# Patient Record
Sex: Male | Born: 1960 | Race: White | Hispanic: No | Marital: Married | Smoking: Former smoker
Health system: Southern US, Community
[De-identification: ages and names within clinical notes are randomized; demographics above are authoritative.]

## PROBLEM LIST (undated history)

## (undated) DIAGNOSIS — I10 Essential (primary) hypertension: Secondary | ICD-10-CM

## (undated) DIAGNOSIS — I2510 Atherosclerotic heart disease of native coronary artery without angina pectoris: Secondary | ICD-10-CM

## (undated) DIAGNOSIS — E119 Type 2 diabetes mellitus without complications: Secondary | ICD-10-CM

## (undated) DIAGNOSIS — J45909 Unspecified asthma, uncomplicated: Secondary | ICD-10-CM

## (undated) DIAGNOSIS — T3110 Burns involving 10-19% of body surface with 0% to 9% third degree burns: Secondary | ICD-10-CM

## (undated) DIAGNOSIS — S3992XA Unspecified injury of lower back, initial encounter: Secondary | ICD-10-CM

## (undated) DIAGNOSIS — M1990 Unspecified osteoarthritis, unspecified site: Secondary | ICD-10-CM

## (undated) HISTORY — PX: OTHER SURGICAL HISTORY: SHX169

## (undated) HISTORY — PX: SHOULDER SURGERY: SHX246

---

## 2010-09-27 HISTORY — PX: CARDIAC CATHETERIZATION: SHX172

## 2015-03-04 ENCOUNTER — Other Ambulatory Visit: Payer: Worker's Compensation | Admitting: Neurosurgery

## 2015-03-25 ENCOUNTER — Encounter (HOSPITAL_COMMUNITY): Payer: Self-pay

## 2015-03-25 ENCOUNTER — Encounter (HOSPITAL_COMMUNITY)
Admission: RE | Admit: 2015-03-25 | Discharge: 2015-03-25 | Disposition: A | Discharge status: Home / Self Care | Payer: Worker's Compensation | Source: Ambulatory Visit | Attending: Neurosurgery | Admitting: Neurosurgery

## 2015-03-25 ENCOUNTER — Other Ambulatory Visit (HOSPITAL_COMMUNITY): Payer: Self-pay | Admitting: *Deleted

## 2015-03-25 DIAGNOSIS — Z01812 Encounter for preprocedural laboratory examination: Secondary | ICD-10-CM | POA: Diagnosis not present

## 2015-03-25 DIAGNOSIS — M5136 Other intervertebral disc degeneration, lumbar region: Secondary | ICD-10-CM | POA: Diagnosis not present

## 2015-03-25 DIAGNOSIS — M4806 Spinal stenosis, lumbar region: Secondary | ICD-10-CM | POA: Insufficient documentation

## 2015-03-25 DIAGNOSIS — I10 Essential (primary) hypertension: Secondary | ICD-10-CM | POA: Insufficient documentation

## 2015-03-25 DIAGNOSIS — Z01818 Encounter for other preprocedural examination: Secondary | ICD-10-CM | POA: Insufficient documentation

## 2015-03-25 DIAGNOSIS — I2510 Atherosclerotic heart disease of native coronary artery without angina pectoris: Secondary | ICD-10-CM | POA: Diagnosis not present

## 2015-03-25 DIAGNOSIS — E119 Type 2 diabetes mellitus without complications: Secondary | ICD-10-CM | POA: Diagnosis not present

## 2015-03-25 DIAGNOSIS — Z87891 Personal history of nicotine dependence: Secondary | ICD-10-CM | POA: Insufficient documentation

## 2015-03-25 DIAGNOSIS — M4316 Spondylolisthesis, lumbar region: Secondary | ICD-10-CM | POA: Insufficient documentation

## 2015-03-25 DIAGNOSIS — M5126 Other intervertebral disc displacement, lumbar region: Secondary | ICD-10-CM | POA: Diagnosis not present

## 2015-03-25 DIAGNOSIS — Z0183 Encounter for blood typing: Secondary | ICD-10-CM | POA: Diagnosis not present

## 2015-03-25 HISTORY — DX: Atherosclerotic heart disease of native coronary artery without angina pectoris: I25.10

## 2015-03-25 HISTORY — DX: Unspecified asthma, uncomplicated: J45.909

## 2015-03-25 HISTORY — DX: Essential (primary) hypertension: I10

## 2015-03-25 HISTORY — DX: Unspecified osteoarthritis, unspecified site: M19.90

## 2015-03-25 HISTORY — DX: Type 2 diabetes mellitus without complications: E11.9

## 2015-03-25 HISTORY — DX: Burns involving 10-19% of body surface with 0% to 9% third degree burns: T31.10

## 2015-03-25 HISTORY — DX: Unspecified injury of lower back, initial encounter: S39.92XA

## 2015-03-25 LAB — BASIC METABOLIC PANEL
Anion gap: 8 (ref 5–15)
BUN: 14 mg/dL (ref 6–20)
CO2: 25 mmol/L (ref 22–32)
Calcium: 8.9 mg/dL (ref 8.9–10.3)
Chloride: 101 mmol/L (ref 101–111)
Creatinine, Ser: 1.06 mg/dL (ref 0.61–1.24)
GFR calc Af Amer: >60 mL/min (ref >60)
Glucose, Bld: 207 mg/dL — ABNORMAL HIGH (ref 65–99)
POTASSIUM: 4.2 mmol/L (ref 3.5–5.1)
SODIUM: 134 mmol/L — AB (ref 135–145)

## 2015-03-25 LAB — ABO/RH: ABO/RH(D): O POS

## 2015-03-25 LAB — SURGICAL PCR SCREEN
MRSA, PCR: NEGATIVE
Staphylococcus aureus: NEGATIVE

## 2015-03-25 LAB — CBC
HCT: 46.1 % (ref 39.0–52.0)
Hemoglobin: 16.3 g/dL (ref 13.0–17.0)
MCH: 29.2 pg (ref 26.0–34.0)
MCHC: 35.4 g/dL (ref 30.0–36.0)
MCV: 82.5 fL (ref 78.0–100.0)
Platelets: 226 10*3/uL (ref 150–400)
RBC: 5.59 MIL/uL (ref 4.22–5.81)
RDW: 12.5 % (ref 11.5–15.5)
WBC: 4.3 10*3/uL (ref 4.0–10.5)

## 2015-03-25 LAB — GLUCOSE, CAPILLARY: Glucose-Capillary: 281 mg/dL — ABNORMAL HIGH (ref 65–99)

## 2015-03-25 LAB — TYPE AND SCREEN
ABO/RH(D): O POS
Antibody Screen: NEGATIVE

## 2015-03-25 NOTE — Pre-Procedure Instructions (Signed)
Patrice ParadiseJonathan Gloster  03/25/2015     Your procedure is scheduled on Thursday, April 03, 2015 at 7:30 AM.   Report to [facility] Entrance "A" Admitting Office at 5:30 AM.   Call this number if you have problems the morning of surgery: [phone]   Any questions prior to day of surgery, please call [phone] between 8 & 4 PM.    Remember:  Do not eat food or drink liquids after midnight Wednesday, 04/02/15.  Take these medicines the morning of surgery with A SIP OF WATER: None  Stop Aspirin 7 days prior to surgery.  Do NOT take any diabetic medications the morning of surgery.   Do not wear jewelry.  Do not wear lotions, powders, or cologne.  You may wear deodorant.  Men may shave face and neck.  Do not bring valuables to the hospital.  [facility] is not responsible for any belongings or valuables.  Contacts, dentures or bridgework may not be worn into surgery.  Leave your suitcase in the car.  After surgery it may be brought to your room.  For patients admitted to the hospital, discharge time will be determined by your treatment team.  Special instructions:  [place] - Preparing for Surgery  Before surgery, you can play an important role.  Because skin is not sterile, your skin needs to be as free of germs as possible.  You can reduce the number of germs on you skin by washing with CHG (chlorahexidine gluconate) soap before surgery.  CHG is an antiseptic cleaner which kills germs and bonds with the skin to continue killing germs even after washing.  Please DO NOT use if you have an allergy to CHG or antibacterial soaps.  If your skin becomes reddened/irritated stop using the CHG and inform your nurse when you arrive at Short Stay.  Do not shave (including legs and underarms) for at least 48 hours prior to the first CHG shower.  You may shave your face.  Please follow these instructions carefully:   1.  Shower with CHG Soap the night before surgery and the                                 morning of Surgery.  2.  If you choose to wash your hair, wash your hair first as usual with your       normal shampoo.  3.  After you shampoo, rinse your hair and body thoroughly to remove the                      Shampoo.  4.  Use CHG as you would any other liquid soap.  You can apply chg directly       to the skin and wash gently with scrungie or a clean washcloth.  5.  Apply the CHG Soap to your body ONLY FROM THE NECK DOWN.        Do not use on open wounds or open sores.  Avoid contact with your eyes, ears, mouth and genitals (private parts).  Wash genitals (private parts) with your normal soap.  6.  Wash thoroughly, paying special attention to the area where your surgery        will be performed.  7.  Thoroughly rinse your body with warm water from the neck down.  8.  DO NOT shower/wash with your normal soap after using and rinsing  off       the CHG Soap.  9.  Pat yourself dry with a clean towel.            10.  Wear clean pajamas.            11.  Place clean sheets on your bed the night of your first shower and do not        sleep with pets.  Day of Surgery  Do not apply any lotions the morning of surgery.  Please wear clean clothes to the hospital.     Please read over the following fact sheets that you were given. Pain Booklet, Coughing and Deep Breathing, Blood Transfusion Information, MRSA Information and Surgical Site Infection Prevention

## 2015-03-25 NOTE — Progress Notes (Signed)
Pt states he had his Hgb A1c drawn last week at his PCP's office. States it was 7.1. Pt's PCP is Dr. Alease FrameJennifer Szurgot [place]. Called office and requested result be faxed to us, spoke with [place]. Pt's CBG was 283 today, instructed pt to check his blood sugars in the AM and make sure they are lower than 250. Encouraged him to watch his diet. He voiced understanding. Endocrinologist is Dr. Tennis MustNancy Blackard in [place].  Pt states that he had a cardiac cath in 2012 and found some small blockages. States he is followed by Dr. Rickard PatienceScott Anderson, cardi[place] in [place]. He denies any recent chest pain or sob. Will request last OV notes from Dr. Dareen PianoAnderson and also any cardiac studies. Will see if they have a copy of cath report from [place]. Pt had an EKG at [facility] in September, 2015. Requested copy of EKG tracing from them. Written report is in Care Everywhere.

## 2015-03-25 NOTE — Progress Notes (Signed)
Spoke with Jomara at Dr. Ewell PoeAnderson's office and they do have a copy of cardiac cath from 2012 and pt was seen in February 2016. He has not had any procedures/tests done. They will fax the cath report and the last office visit notes.
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
## 2015-04-02 MED ORDER — CEFAZOLIN SODIUM-DEXTROSE 2-3 GM-% IV SOLR
2.0000 g | INTRAVENOUS | Status: AC
  Administered 2015-04-03: 2 g via INTRAVENOUS
  Filled 2015-04-02: qty 50
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
## 2015-04-03 ENCOUNTER — Inpatient Hospital Stay (HOSPITAL_COMMUNITY): Payer: Worker's Compensation | Admitting: Emergency Medicine

## 2015-04-03 ENCOUNTER — Inpatient Hospital Stay (HOSPITAL_COMMUNITY)
Admission: RE | Admit: 2015-04-03 | Discharge: 2015-04-04 | DRG: 460 | Disposition: A | Discharge status: Home / Self Care | Payer: Worker's Compensation | Source: Ambulatory Visit | Attending: Neurosurgery | Admitting: Neurosurgery

## 2015-04-03 ENCOUNTER — Encounter (HOSPITAL_COMMUNITY)
Admission: RE | Disposition: A | Discharge status: Home / Self Care | Payer: Self-pay | Source: Ambulatory Visit | Attending: Neurosurgery

## 2015-04-03 ENCOUNTER — Inpatient Hospital Stay (HOSPITAL_COMMUNITY): Payer: Worker's Compensation | Admitting: Anesthesiology

## 2015-04-03 ENCOUNTER — Encounter (HOSPITAL_COMMUNITY): Payer: Self-pay | Admitting: Certified Registered"

## 2015-04-03 ENCOUNTER — Inpatient Hospital Stay (HOSPITAL_COMMUNITY): Payer: Worker's Compensation

## 2015-04-03 DIAGNOSIS — Z794 Long term (current) use of insulin: Secondary | ICD-10-CM | POA: Diagnosis not present

## 2015-04-03 DIAGNOSIS — Z87891 Personal history of nicotine dependence: Secondary | ICD-10-CM | POA: Diagnosis not present

## 2015-04-03 DIAGNOSIS — Z79899 Other long term (current) drug therapy: Secondary | ICD-10-CM | POA: Diagnosis not present

## 2015-04-03 DIAGNOSIS — Z419 Encounter for procedure for purposes other than remedying health state, unspecified: Secondary | ICD-10-CM

## 2015-04-03 DIAGNOSIS — M5116 Intervertebral disc disorders with radiculopathy, lumbar region: Secondary | ICD-10-CM | POA: Diagnosis present

## 2015-04-03 DIAGNOSIS — E119 Type 2 diabetes mellitus without complications: Secondary | ICD-10-CM | POA: Diagnosis present

## 2015-04-03 DIAGNOSIS — M4806 Spinal stenosis, lumbar region: Secondary | ICD-10-CM | POA: Diagnosis present

## 2015-04-03 DIAGNOSIS — E780 Pure hypercholesterolemia: Secondary | ICD-10-CM | POA: Diagnosis present

## 2015-04-03 DIAGNOSIS — M545 Low back pain: Secondary | ICD-10-CM | POA: Diagnosis present

## 2015-04-03 DIAGNOSIS — M4316 Spondylolisthesis, lumbar region: Secondary | ICD-10-CM | POA: Diagnosis present

## 2015-04-03 DIAGNOSIS — I10 Essential (primary) hypertension: Secondary | ICD-10-CM | POA: Diagnosis present

## 2015-04-03 HISTORY — PX: MAXIMUM ACCESS (MAS)POSTERIOR LUMBAR INTERBODY FUSION (PLIF) 1 LEVEL: SHX6368

## 2015-04-03 LAB — GLUCOSE, CAPILLARY
GLUCOSE-CAPILLARY: 147 mg/dL — AB (ref 65–99)
GLUCOSE-CAPILLARY: 192 mg/dL — AB (ref 65–99)
GLUCOSE-CAPILLARY: 213 mg/dL — AB (ref 65–99)
Glucose-Capillary: 181 mg/dL — ABNORMAL HIGH (ref 65–99)
Glucose-Capillary: 199 mg/dL — ABNORMAL HIGH (ref 65–99)

## 2015-04-03 SURGERY — FOR MAXIMUM ACCESS (MAS) POSTERIOR LUMBAR INTERBODY FUSION (PLIF) 1 LEVEL
Anesthesia: General | Site: Back

## 2015-04-03 MED ORDER — DEXAMETHASONE SODIUM PHOSPHATE 4 MG/ML IJ SOLN
INTRAMUSCULAR | Status: AC
  Filled 2015-04-03: qty 1

## 2015-04-03 MED ORDER — SUFENTANIL CITRATE 50 MCG/ML IV SOLN
INTRAVENOUS | Status: DC | PRN
  Administered 2015-04-03 (×3): 10 ug via INTRAVENOUS
  Administered 2015-04-03: 20 ug via INTRAVENOUS

## 2015-04-03 MED ORDER — HYDROMORPHONE HCL 1 MG/ML IJ SOLN
INTRAMUSCULAR | Status: AC
  Administered 2015-04-03: 0.5 mg via INTRAVENOUS
  Filled 2015-04-03: qty 2

## 2015-04-03 MED ORDER — GLYCOPYRROLATE 0.2 MG/ML IJ SOLN
INTRAMUSCULAR | Status: AC
  Filled 2015-04-03: qty 2

## 2015-04-03 MED ORDER — BUPIVACAINE HCL (PF) 0.5 % IJ SOLN
INTRAMUSCULAR | Status: DC | PRN
  Administered 2015-04-03: 10 mL

## 2015-04-03 MED ORDER — SUFENTANIL CITRATE 50 MCG/ML IV SOLN
INTRAVENOUS | Status: AC
  Filled 2015-04-03: qty 1

## 2015-04-03 MED ORDER — SODIUM CHLORIDE 0.9 % IJ SOLN
3.0000 mL | Freq: Two times a day (BID) | INTRAMUSCULAR | Status: DC
Start: 2015-04-03 — End: 2015-04-04
  Administered 2015-04-03: 3 mL via INTRAVENOUS

## 2015-04-03 MED ORDER — ACETAMINOPHEN 650 MG RE SUPP: 650.0000 mg | RECTAL | Status: DC | PRN

## 2015-04-03 MED ORDER — PROPOFOL 10 MG/ML IV BOLUS
INTRAVENOUS | Status: DC | PRN
  Administered 2015-04-03: 200 mg via INTRAVENOUS
  Administered 2015-04-03: 100 mg via INTRAVENOUS

## 2015-04-03 MED ORDER — FLEET ENEMA 7-19 GM/118ML RE ENEM
1.0000 | ENEMA | Freq: Once | RECTAL | Status: AC | PRN
  Filled 2015-04-03: qty 1

## 2015-04-03 MED ORDER — ACETAMINOPHEN 325 MG PO TABS: 650.0000 mg | ORAL_TABLET | ORAL | Status: DC | PRN

## 2015-04-03 MED ORDER — SODIUM CHLORIDE 0.9 % IJ SOLN: 3.0000 mL | INTRAMUSCULAR | Status: DC | PRN

## 2015-04-03 MED ORDER — THROMBIN 20000 UNITS EX SOLR
CUTANEOUS | Status: DC | PRN
  Administered 2015-04-03: 20 mL via TOPICAL

## 2015-04-03 MED ORDER — ONDANSETRON HCL 4 MG/2ML IJ SOLN
INTRAMUSCULAR | Status: AC
  Filled 2015-04-03: qty 2

## 2015-04-03 MED ORDER — PROPOFOL 10 MG/ML IV BOLUS
INTRAVENOUS | Status: AC
  Filled 2015-04-03: qty 20

## 2015-04-03 MED ORDER — CEFAZOLIN SODIUM-DEXTROSE 2-3 GM-% IV SOLR
2.0000 g | Freq: Three times a day (TID) | INTRAVENOUS | Status: DC
  Filled 2015-04-03 (×2): qty 50

## 2015-04-03 MED ORDER — DOCUSATE SODIUM 100 MG PO CAPS
100.0000 mg | ORAL_CAPSULE | Freq: Two times a day (BID) | ORAL | Status: DC
  Administered 2015-04-03 – 2015-04-04 (×2): 100 mg via ORAL
  Filled 2015-04-03: qty 1

## 2015-04-03 MED ORDER — DEXTROSE 5 % IV SOLN
500.0000 mg | Freq: Four times a day (QID) | INTRAVENOUS | Status: DC | PRN
  Filled 2015-04-03: qty 5

## 2015-04-03 MED ORDER — METFORMIN HCL 500 MG PO TABS
500.0000 mg | ORAL_TABLET | Freq: Two times a day (BID) | ORAL | Status: DC
  Administered 2015-04-03 – 2015-04-04 (×2): 500 mg via ORAL
  Filled 2015-04-03 (×4): qty 1

## 2015-04-03 MED ORDER — ONDANSETRON HCL 4 MG/2ML IJ SOLN
INTRAMUSCULAR | Status: DC | PRN
  Administered 2015-04-03: 4 mg via INTRAVENOUS

## 2015-04-03 MED ORDER — DULAGLUTIDE 0.75 MG/0.5ML ~~LOC~~ SOAJ: 0.7500 mg | SUBCUTANEOUS | Status: DC

## 2015-04-03 MED ORDER — BUPIVACAINE LIPOSOME 1.3 % IJ SUSP
INTRAMUSCULAR | Status: DC | PRN
  Administered 2015-04-03: 20 mL

## 2015-04-03 MED ORDER — INSULIN ASPART 100 UNIT/ML ~~LOC~~ SOLN: 0.0000 [IU] | Freq: Every day | SUBCUTANEOUS | Status: DC

## 2015-04-03 MED ORDER — PHENYLEPHRINE 40 MCG/ML (10ML) SYRINGE FOR IV PUSH (FOR BLOOD PRESSURE SUPPORT)
PREFILLED_SYRINGE | INTRAVENOUS | Status: AC
  Filled 2015-04-03: qty 10

## 2015-04-03 MED ORDER — LIDOCAINE-EPINEPHRINE 1 %-1:100000 IJ SOLN
INTRAMUSCULAR | Status: DC | PRN
  Administered 2015-04-03: 10 mL

## 2015-04-03 MED ORDER — PANTOPRAZOLE SODIUM 40 MG IV SOLR
40.0000 mg | Freq: Every day | INTRAVENOUS | Status: DC
  Filled 2015-04-03: qty 40

## 2015-04-03 MED ORDER — HYDROMORPHONE HCL 1 MG/ML IJ SOLN
0.5000 mg | INTRAMUSCULAR | Status: DC | PRN
  Administered 2015-04-03 – 2015-04-04 (×4): 1 mg via INTRAVENOUS
  Filled 2015-04-03 (×4): qty 1

## 2015-04-03 MED ORDER — ALUM & MAG HYDROXIDE-SIMETH 200-200-20 MG/5ML PO SUSP
30.0000 mL | Freq: Four times a day (QID) | ORAL | Status: DC | PRN
  Administered 2015-04-03: 30 mL via ORAL
  Filled 2015-04-03: qty 30

## 2015-04-03 MED ORDER — DEXAMETHASONE SODIUM PHOSPHATE 4 MG/ML IJ SOLN
INTRAMUSCULAR | Status: DC | PRN
  Administered 2015-04-03: 4 mg via INTRAVENOUS

## 2015-04-03 MED ORDER — PROMETHAZINE HCL 25 MG/ML IJ SOLN: 6.2500 mg | INTRAMUSCULAR | Status: DC | PRN

## 2015-04-03 MED ORDER — SODIUM CHLORIDE 0.9 % IV SOLN: 250.0000 mL | INTRAVENOUS | Status: DC

## 2015-04-03 MED ORDER — SUCCINYLCHOLINE CHLORIDE 20 MG/ML IJ SOLN
INTRAMUSCULAR | Status: DC | PRN
  Administered 2015-04-03: 50 mg via INTRAVENOUS

## 2015-04-03 MED ORDER — LIDOCAINE HCL (CARDIAC) 20 MG/ML IV SOLN
INTRAVENOUS | Status: AC
  Filled 2015-04-03: qty 5

## 2015-04-03 MED ORDER — KCL IN DEXTROSE-NACL 20-5-0.45 MEQ/L-%-% IV SOLN
INTRAVENOUS | Status: DC
  Filled 2015-04-03 (×3): qty 1000

## 2015-04-03 MED ORDER — SODIUM CHLORIDE 0.9 % IJ SOLN
INTRAMUSCULAR | Status: AC
  Filled 2015-04-03: qty 10

## 2015-04-03 MED ORDER — MIDAZOLAM HCL 5 MG/5ML IJ SOLN
INTRAMUSCULAR | Status: DC | PRN
  Administered 2015-04-03: 2 mg via INTRAVENOUS

## 2015-04-03 MED ORDER — HYDROMORPHONE HCL 1 MG/ML IJ SOLN
0.2500 mg | INTRAMUSCULAR | Status: DC | PRN
  Administered 2015-04-03 (×4): 0.5 mg via INTRAVENOUS

## 2015-04-03 MED ORDER — MENTHOL 3 MG MT LOZG: 1.0000 | LOZENGE | OROMUCOSAL | Status: DC | PRN

## 2015-04-03 MED ORDER — SUCCINYLCHOLINE CHLORIDE 20 MG/ML IJ SOLN
INTRAMUSCULAR | Status: AC
  Filled 2015-04-03: qty 1

## 2015-04-03 MED ORDER — OXYCODONE-ACETAMINOPHEN 5-325 MG PO TABS
1.0000 | ORAL_TABLET | ORAL | Status: DC | PRN
  Administered 2015-04-03 – 2015-04-04 (×6): 2 via ORAL
  Filled 2015-04-03 (×5): qty 2

## 2015-04-03 MED ORDER — OXYCODONE-ACETAMINOPHEN 5-325 MG PO TABS
ORAL_TABLET | ORAL | Status: AC
  Administered 2015-04-03: 2 via ORAL
  Filled 2015-04-03: qty 2

## 2015-04-03 MED ORDER — ONDANSETRON HCL 4 MG/2ML IJ SOLN: 4.0000 mg | INTRAMUSCULAR | Status: DC | PRN

## 2015-04-03 MED ORDER — LACTATED RINGERS IV SOLN
INTRAVENOUS | Status: DC | PRN
  Administered 2015-04-03 (×2): via INTRAVENOUS

## 2015-04-03 MED ORDER — METHOCARBAMOL 500 MG PO TABS
ORAL_TABLET | ORAL | Status: AC
  Administered 2015-04-03: 500 mg via ORAL
  Filled 2015-04-03: qty 1

## 2015-04-03 MED ORDER — MIDAZOLAM HCL 2 MG/2ML IJ SOLN
INTRAMUSCULAR | Status: AC
  Filled 2015-04-03: qty 2

## 2015-04-03 MED ORDER — HYDROCODONE-ACETAMINOPHEN 5-325 MG PO TABS: 1.0000 | ORAL_TABLET | ORAL | Status: DC | PRN

## 2015-04-03 MED ORDER — BUPIVACAINE LIPOSOME 1.3 % IJ SUSP
20.0000 mL | Freq: Once | INTRAMUSCULAR | Status: DC
  Filled 2015-04-03: qty 20

## 2015-04-03 MED ORDER — PHENOL 1.4 % MT LIQD: 1.0000 | OROMUCOSAL | Status: DC | PRN

## 2015-04-03 MED ORDER — MEPERIDINE HCL 25 MG/ML IJ SOLN: 6.2500 mg | INTRAMUSCULAR | Status: DC | PRN

## 2015-04-03 MED ORDER — EMPAGLIFLOZIN 10 MG PO TABS: 10.0000 mg | ORAL_TABLET | Freq: Every day | ORAL | Status: DC

## 2015-04-03 MED ORDER — NEOSTIGMINE METHYLSULFATE 10 MG/10ML IV SOLN
INTRAVENOUS | Status: AC
  Filled 2015-04-03: qty 1

## 2015-04-03 MED ORDER — MEPERIDINE HCL 25 MG/ML IJ SOLN
INTRAMUSCULAR | Status: AC
  Filled 2015-04-03: qty 1

## 2015-04-03 MED ORDER — PHENYLEPHRINE HCL 10 MG/ML IJ SOLN
INTRAMUSCULAR | Status: DC | PRN
  Administered 2015-04-03 (×3): 80 ug via INTRAVENOUS

## 2015-04-03 MED ORDER — INSULIN ASPART 100 UNIT/ML ~~LOC~~ SOLN
0.0000 [IU] | Freq: Three times a day (TID) | SUBCUTANEOUS | Status: DC
  Administered 2015-04-04: 3 [IU] via SUBCUTANEOUS

## 2015-04-03 MED ORDER — BISACODYL 10 MG RE SUPP: 10.0000 mg | Freq: Every day | RECTAL | Status: DC | PRN

## 2015-04-03 MED ORDER — POLYETHYLENE GLYCOL 3350 17 G PO PACK
17.0000 g | PACK | Freq: Every day | ORAL | Status: DC | PRN
  Filled 2015-04-03: qty 1

## 2015-04-03 MED ORDER — LIDOCAINE HCL (CARDIAC) 20 MG/ML IV SOLN
INTRAVENOUS | Status: DC | PRN
  Administered 2015-04-03: 100 mg via INTRAVENOUS

## 2015-04-03 MED ORDER — ROSUVASTATIN CALCIUM 10 MG PO TABS
10.0000 mg | ORAL_TABLET | Freq: Every day | ORAL | Status: DC
  Administered 2015-04-03: 10 mg via ORAL
  Filled 2015-04-03 (×2): qty 1

## 2015-04-03 MED ORDER — 0.9 % SODIUM CHLORIDE (POUR BTL) OPTIME
TOPICAL | Status: DC | PRN
  Administered 2015-04-03: 1000 mL

## 2015-04-03 MED ORDER — METHOCARBAMOL 500 MG PO TABS
500.0000 mg | ORAL_TABLET | Freq: Four times a day (QID) | ORAL | Status: DC | PRN
  Administered 2015-04-03 – 2015-04-04 (×4): 500 mg via ORAL
  Filled 2015-04-03 (×3): qty 1

## 2015-04-03 MED ORDER — CEFAZOLIN SODIUM-DEXTROSE 2-3 GM-% IV SOLR
2.0000 g | Freq: Three times a day (TID) | INTRAVENOUS | Status: AC
Start: 2015-04-03 — End: 2015-04-03
  Administered 2015-04-03 (×2): 2 g via INTRAVENOUS
  Filled 2015-04-03 (×2): qty 50

## 2015-04-03 MED ORDER — LISINOPRIL 10 MG PO TABS
10.0000 mg | ORAL_TABLET | Freq: Every day | ORAL | Status: DC
  Administered 2015-04-03 – 2015-04-04 (×2): 10 mg via ORAL
  Filled 2015-04-03 (×2): qty 1

## 2015-04-03 MED ORDER — PHENYLEPHRINE HCL 10 MG/ML IJ SOLN
10.0000 mg | INTRAVENOUS | Status: DC | PRN
  Administered 2015-04-03: 20 ug/min via INTRAVENOUS

## 2015-04-03 MED ORDER — ASPIRIN EC 81 MG PO TBEC
81.0000 mg | DELAYED_RELEASE_TABLET | Freq: Every day | ORAL | Status: DC
  Administered 2015-04-04: 81 mg via ORAL
  Filled 2015-04-03: qty 1

## 2015-04-03 MED ORDER — PANTOPRAZOLE SODIUM 40 MG PO TBEC
40.0000 mg | DELAYED_RELEASE_TABLET | Freq: Every day | ORAL | Status: DC
  Administered 2015-04-03 – 2015-04-04 (×2): 40 mg via ORAL
  Filled 2015-04-03: qty 1

## 2015-04-03 SURGICAL SUPPLY — 74 items
BENZOIN TINCTURE PRP APPL 2/3 (GAUZE/BANDAGES/DRESSINGS) ×2 IMPLANT
BLADE CLIPPER SURG (BLADE) ×2 IMPLANT
BONE MATRIX OSTEOCEL PRO MED (Bone Implant) ×2 IMPLANT
BUR MATCHSTICK NEURO 3.0 LAGG (BURR) ×2 IMPLANT
BUR ROUND FLUTED 5 RND (BURR) ×2 IMPLANT
CAGE COROENT PLIF 10X28-8 LUMB (Cage) ×4 IMPLANT
CANISTER SUCT 3000ML PPV (MISCELLANEOUS) ×2 IMPLANT
CLIP NEUROVISION LG (CLIP) ×2 IMPLANT
CONT SPEC 4OZ CLIKSEAL STRL BL (MISCELLANEOUS) ×4 IMPLANT
COVER BACK TABLE 24X17X13 BIG (DRAPES) IMPLANT
COVER BACK TABLE 60X90IN (DRAPES) ×2 IMPLANT
DECANTER SPIKE VIAL GLASS SM (MISCELLANEOUS) ×2 IMPLANT
DERMABOND ADVANCED (GAUZE/BANDAGES/DRESSINGS) ×1
DERMABOND ADVANCED .7 DNX12 (GAUZE/BANDAGES/DRESSINGS) ×1 IMPLANT
DRAPE C-ARM 42X72 X-RAY (DRAPES) ×2 IMPLANT
DRAPE C-ARMOR (DRAPES) ×2 IMPLANT
DRAPE LAPAROTOMY 100X72X124 (DRAPES) ×2 IMPLANT
DRAPE POUCH INSTRU U-SHP 10X18 (DRAPES) ×2 IMPLANT
DRAPE SURG 17X23 STRL (DRAPES) ×2 IMPLANT
DRSG OPSITE POSTOP 3X4 (GAUZE/BANDAGES/DRESSINGS) ×2 IMPLANT
DRSG TELFA 3X8 NADH (GAUZE/BANDAGES/DRESSINGS) IMPLANT
DURAPREP 26ML APPLICATOR (WOUND CARE) ×2 IMPLANT
ELECT REM PT RETURN 9FT ADLT (ELECTROSURGICAL) ×2
ELECTRODE REM PT RTRN 9FT ADLT (ELECTROSURGICAL) ×1 IMPLANT
EVACUATOR 1/8 PVC DRAIN (DRAIN) IMPLANT
GAUZE SPONGE 4X4 12PLY STRL (GAUZE/BANDAGES/DRESSINGS) IMPLANT
GAUZE SPONGE 4X4 16PLY XRAY LF (GAUZE/BANDAGES/DRESSINGS) IMPLANT
GLOVE BIO SURGEON STRL SZ8 (GLOVE) ×2 IMPLANT
GLOVE BIOGEL PI IND STRL 8 (GLOVE) ×1 IMPLANT
GLOVE BIOGEL PI IND STRL 8.5 (GLOVE) ×1 IMPLANT
GLOVE BIOGEL PI INDICATOR 8 (GLOVE) ×1
GLOVE BIOGEL PI INDICATOR 8.5 (GLOVE) ×1
GLOVE ECLIPSE 8.0 STRL XLNG CF (GLOVE) ×2 IMPLANT
GOWN STRL REUS W/ TWL LRG LVL3 (GOWN DISPOSABLE) IMPLANT
GOWN STRL REUS W/ TWL XL LVL3 (GOWN DISPOSABLE) ×2 IMPLANT
GOWN STRL REUS W/TWL 2XL LVL3 (GOWN DISPOSABLE) ×4 IMPLANT
GOWN STRL REUS W/TWL LRG LVL3 (GOWN DISPOSABLE)
GOWN STRL REUS W/TWL XL LVL3 (GOWN DISPOSABLE) ×2
KIT BASIN OR (CUSTOM PROCEDURE TRAY) ×2 IMPLANT
KIT NEEDLE NVM5 EMG ELECT (KITS) ×1 IMPLANT
KIT NEEDLE NVM5 EMG ELECTRODE (KITS) ×1
KIT POSITION SURG JACKSON T1 (MISCELLANEOUS) ×2 IMPLANT
KIT ROOM TURNOVER OR (KITS) ×2 IMPLANT
MILL MEDIUM DISP (BLADE) ×2 IMPLANT
NEEDLE HYPO 25X1 1.5 SAFETY (NEEDLE) ×2 IMPLANT
NEEDLE SPNL 18GX3.5 QUINCKE PK (NEEDLE) IMPLANT
NS IRRIG 1000ML POUR BTL (IV SOLUTION) ×2 IMPLANT
PACK LAMINECTOMY NEURO (CUSTOM PROCEDURE TRAY) ×2 IMPLANT
PAD ARMBOARD 7.5X6 YLW CONV (MISCELLANEOUS) ×6 IMPLANT
PATTIES SURGICAL .5 X.5 (GAUZE/BANDAGES/DRESSINGS) IMPLANT
PATTIES SURGICAL .5 X1 (DISPOSABLE) IMPLANT
PATTIES SURGICAL 1X1 (DISPOSABLE) IMPLANT
ROD 5.5X40MM (Rod) ×4 IMPLANT
SCREW LOCK (Screw) ×4 IMPLANT
SCREW LOCK FXNS SPNE MAS PL (Screw) ×4 IMPLANT
SCREW SHANK 5.5X40MM (Screw) ×4 IMPLANT
SCREW SHANK PLIF MAS 5.5X40 (Screw) ×4 IMPLANT
SCREW TULIP 5.5 (Screw) ×4 IMPLANT
SPONGE LAP 4X18 X RAY DECT (DISPOSABLE) IMPLANT
SPONGE SURGIFOAM ABS GEL 100 (HEMOSTASIS) ×2 IMPLANT
STAPLER SKIN PROX WIDE 3.9 (STAPLE) IMPLANT
STRIP CLOSURE SKIN 1/2X4 (GAUZE/BANDAGES/DRESSINGS) ×2 IMPLANT
SURE STEP FOLEY TRAY SYSTEM ×2 IMPLANT
SUT VIC AB 1 CT1 18XBRD ANBCTR (SUTURE) ×2 IMPLANT
SUT VIC AB 1 CT1 8-18 (SUTURE) ×2
SUT VIC AB 2-0 CT1 18 (SUTURE) ×4 IMPLANT
SUT VIC AB 3-0 SH 8-18 (SUTURE) ×4 IMPLANT
SYR 20ML ECCENTRIC (SYRINGE) IMPLANT
SYR 5ML LL (SYRINGE) IMPLANT
TOWEL OR 17X24 6PK STRL BLUE (TOWEL DISPOSABLE) ×2 IMPLANT
TOWEL OR 17X26 10 PK STRL BLUE (TOWEL DISPOSABLE) ×2 IMPLANT
TRAP SPECIMEN MUCOUS 40CC (MISCELLANEOUS) ×2 IMPLANT
TRAY FOLEY W/METER SILVER 14FR (SET/KITS/TRAYS/PACK) IMPLANT
WATER STERILE IRR 1000ML POUR (IV SOLUTION) ×2 IMPLANT

## 2015-04-03 NOTE — Op Note (Signed)
04/03/2015  10:15 AM  PATIENT:  Kyle Wilkinson  54 y.o. male  PRE-OPERATIVE DIAGNOSIS:  Grade 2 Spondylolisthesis, Lumbar region; Spinal stenosis of lumbar region; Disc displacement, Lumbar; Degenerative disc disease, Lumbar, lumbar radiculopathy L 45 level  POST-OPERATIVE DIAGNOSIS:  Grade 2 Spondylolisthesis, Lumbar region; Spinal stenosis of lumbar region; Disc displacement, Lumbar; Degenerative disc disease, Lumbar, lumbar radiculopathy L 45 level  PROCEDURE:  Procedure(s) with comments: L4-5 Maximum access posterior lumbar interbody fusion (N/A) - L4-5 Maximum access posterior lumbar interbody fusion with PEEK cages, auto and allograft, pedicle screw fixation and posterolateral arthrodesis with autograft and allograft  Decompression greater than for standard PLIF procedure  SURGEON:  Surgeon(s) and Role:    * Maeola HarmanJoseph Patton Rabinovich, MD - Primary    * Aliene Beamsandy Kritzer, MD - Assisting  PHYSICIAN ASSISTANT:   ASSISTANTS: Poteat, RN   ANESTHESIA:   general  EBL:  Total I/O In: 1000 [I.V.:1000] Out: 175 [Urine:125; Blood:50]  BLOOD ADMINISTERED:none  DRAINS: none   LOCAL MEDICATIONS USED:  MARCAINE    and LIDOCAINE   SPECIMEN:  No Specimen  DISPOSITION OF SPECIMEN:  N/A  COUNTS:  YES  TOURNIQUET:  * No tourniquets in log *  DICTATION: Patient is a 54 year old with  stenosis, Grade 2 spondylolisthesis, disc herniation and severe back and bilateral lower extremity pain at L4/5 levels of the lumbar spine. It was elected to take him to surgery for MASPLIF L 45 level with posterolateral arthrodesis.  Procedure:   Following uncomplicated induction of GETA, and placement of electrodes for neural monitoring, patient was turned into a prone position on the [place]ora[place] table and using AP  fluoroscopy the area of planned incision was marked, prepped with betadine scrub and Duraprep, then draped. Exposure was performed of facet joint complex at L 45 level and the MAS retractor was placed.5.5 x 40 mm  cortical Nuvasive screws were placed at L 4 bilaterally according to standard landmarks using neural monitoring.  A total laminectomy of L 4 was then performed with disarticulation of facets.  This bone was saved for grafting, combined with Osteocel after being run through bone mill and was placed in bone packing device.  Thorough discectomy was performed bilaterally at L 45 and the endplates were prepared for grafting.  Decompression was greater than for standard PLIF procedure.  Both L 4 nerve roots required painstaking decompression under Loupe magnification.  Decompression was greater than for standard PLIF procedure.  23 x 10 x 8 degree cages were placed in the interspace and positioning was confirmed with AP and lateral fluoroscopy.  10 cc of autograft/Osteocel was packed in the interspace medial to the second cage.   Remaining screws were placed at L 5 (5.5 x 40 mm) and 40 mm rods were placed.   And the screws were locked and torqued.Final Xrays showed well positioned implants and screw fixation. The posterolateral region was packed with remaining 15 cc of autograft on the right of midline. Subcutaneous musculature was infiltrated with long-acting Marcaine.  The wounds were irrigated and then closed with 1, 2-0 and 3-0 Vicryl stitches. Sterile occlusive dressing was placed with Dermabond and an occlusive dressing. The patient was then extubated in the operating room and taken to recovery in stable and satisfactory condition having tolerated her operation well. Counts were correct at the end of the case.  PLAN OF CARE: Admit to inpatient   PATIENT DISPOSITION:  PACU - hemodynamically stable.   Delay start of Pharmacological VTE agent (>24hrs) due to surgical blood loss  or risk of bleeding: yes

## 2015-04-03 NOTE — Anesthesia Postprocedure Evaluation (Signed)
  Anesthesia Post-op Note  Patient: Kyle Wilkinson  Procedure(s) Performed: Procedure(s) with comments: L4-5 Maximum access posterior lumbar interbody fusion (N/A) - L4-5 Maximum access posterior lumbar interbody fusion  Patient Location: PACU  Anesthesia Type:General  Level of Consciousness: awake and alert   Airway and Oxygen Therapy: Patient Spontanous Breathing and Patient connected to nasal cannula oxygen  Post-op Pain: mild  Post-op Assessment: Post-op Vital signs reviewed, Patient's Cardiovascular Status Stable, Respiratory Function Stable, Patent Airway and No signs of Nausea or vomiting              Post-op Vital Signs: Reviewed and stable  Last Vitals:  Filed Vitals:   04/03/15 0608  BP:   Pulse:   Temp: 36.5 C  Resp:     Complications: No apparent anesthesia complications

## 2015-04-03 NOTE — Transfer of Care (Signed)
Immediate Anesthesia Transfer of Care Note  Patient: Kyle ParadiseJonathan Wilkinson  Procedure(s) Performed: Procedure(s) with comments: L4-5 Maximum access posterior lumbar interbody fusion (N/A) - L4-5 Maximum access posterior lumbar interbody fusion  Patient Location: PACU  Anesthesia Type:General  Level of Consciousness: sedated, patient cooperative and responds to stimulation  Airway & Oxygen Therapy: Patient Spontanous Breathing and Patient connected to nasal cannula oxygen  Post-op Assessment: Report given to RN, Post -op Vital signs reviewed and stable and Patient moving all extremities X 4  Post vital signs: Reviewed and stable  Last Vitals:  Filed Vitals:   04/03/15 0608  BP:   Pulse:   Temp: 36.5 C  Resp:     Complications: No apparent anesthesia complications

## 2015-04-03 NOTE — Interval H&P Note (Signed)
History and Physical Interval Note:  04/03/2015 7:30 AM  Kyle Wilkinson  has presented today for surgery, with the diagnosis of Spondylolisthesis, Lumbar region; Spinal stenosis of lumbar region; Disc displacement, Lumbar; Degenerative disc disease, Lumbar  The various methods of treatment have been discussed with the patient and family. After consideration of risks, benefits and other options for treatment, the patient has consented to  Procedure(s) with comments: L4-5 Maximum access posterior lumbar interbody fusion (N/A) - L4-5 Maximum access posterior lumbar interbody fusion as a surgical intervention .  The patient's history has been reviewed, patient examined, no change in status, stable for surgery.  I have reviewed the patient's chart and labs.  Questions were answered to the patient's satisfaction.     Kyle Wilkinson

## 2015-04-03 NOTE — Anesthesia Procedure Notes (Signed)
Procedure Name: Intubation Date/Time: 04/03/2015 7:35 AM Performed by: Melina SchoolsBANKS, Arizbeth Cawthorn J Pre-anesthesia Checklist: Patient identified, Emergency Drugs available, Suction available, Patient being monitored and Timeout performed Patient Re-evaluated:Patient Re-evaluated prior to inductionOxygen Delivery Method: Circle system utilized Preoxygenation: Pre-oxygenation with 100% oxygen Intubation Type: IV induction Laryngoscope Size: Mac and 3 Grade View: Grade I Tube type: Oral Tube size: 8.0 mm Number of attempts: 1 Airway Equipment and Method: Stylet Placement Confirmation: ETT inserted through vocal cords under direct vision,  positive ETCO2 and breath sounds checked- equal and bilateral Secured at: 24 cm Tube secured with: Tape Dental Injury: Teeth and Oropharynx as per pre-operative assessment

## 2015-04-03 NOTE — Evaluation (Signed)
Physical Therapy Evaluation Patient Details Name: Kyle Wilkinson MRN: [id number] DOB: [birth year] Today's Date: 04/03/2015   History of Present Illness  Patient is a 54 yo male s/p L4-5 Maximum access posterior lumbar interbody fusion   Clinical Impression  Patient demonstrates deficits in functional mobility as indicated below. Will need continued skilled PT to address deficits and maximize function. Will see as indicated and progress as tolerated.     Follow Up Recommendations No PT follow up;Supervision - Intermittent    Equipment Recommendations  None recommended by PT    Recommendations for Other Services       Precautions / Restrictions Precautions Precautions: Back Precaution Booklet Issued: Yes (comment) Precaution Comments: verbally reviewed with patient Required Braces or Orthoses: Spinal Brace Spinal Brace: Lumbar corset;Applied in sitting position Restrictions Weight Bearing Restrictions: No      Mobility  Bed Mobility Overal bed mobility: Needs Assistance Bed Mobility: Rolling;Sidelying to Sit;Sit to Sidelying Rolling: Min guard Sidelying to sit: Min guard     Sit to sidelying: Min guard General bed mobility comments: VCs for positioning and technique  Transfers Overall transfer level: Needs assistance Equipment used: Rolling walker (2 wheeled) Transfers: Sit to/from Stand Sit to Stand: Min assist         General transfer comment: VCs for hand placement and safety  Ambulation/Gait Ambulation/Gait assistance: Min guard Ambulation Distance (Feet): 80 Feet Assistive device: Rolling walker (2 wheeled) Gait Pattern/deviations: WFL(Within Functional Limits);Step-through pattern;Decreased stride length;Narrow base of support Gait velocity: decreased Gait velocity interpretation: Below normal speed for age/gender General Gait Details: Cues for increased cadence and normalized step through stride  Stairs            Wheelchair Mobility     Modified Rankin (Stroke Patients Only)       Balance Overall balance assessment: Needs assistance Sitting-balance support: Feet supported Sitting balance-Leahy Scale: Fair     Standing balance support: During functional activity Standing balance-Leahy Scale: Fair Standing balance comment: reliance on RW                             Pertinent Vitals/Pain Pain Assessment: 0-10 Pain Score: 8  Pain Location: back Pain Descriptors / Indicators: Aching;Discomfort;Operative site guarding Pain Intervention(s): Limited activity within patient's tolerance;Monitored during session;Repositioned    Home Living Family/patient expects to be discharged to:: Private residence Living Arrangements: Spouse/significant other Available Help at Discharge: Family;Available 24 hours/day Type of Home: House Home Access: Stairs to enter Entrance Stairs-Rails: None Entrance Stairs-Number of Steps: 5 Home Layout: Multi-level;Able to live on main level with bedroom/bathroom Home Equipment: Dan Humphreys - 2 wheels      Prior Function Level of Independence: Independent with assistive device(s)         Comments: used a RW at times     Hand Dominance   Dominant Hand: Right    Extremity/Trunk Assessment   Upper Extremity Assessment: Overall WFL for tasks assessed           Lower Extremity Assessment: Overall WFL for tasks assessed         Communication   Communication: No difficulties  Cognition Arousal/Alertness: Awake/alert Behavior During Therapy: WFL for tasks assessed/performed Overall Cognitive Status: Within Functional Limits for tasks assessed                      General Comments      Exercises        Assessment/Plan  PT Assessment Patient needs continued PT services  PT Diagnosis Difficulty walking;Abnormality of gait;Acute pain   PT Problem List Decreased strength;Decreased activity tolerance;Decreased balance;Decreased mobility;Decreased  knowledge of use of DME;Decreased knowledge of precautions  PT Treatment Interventions DME instruction;Gait training;Stair training;Functional mobility training;Therapeutic activities;Therapeutic exercise;Balance training;Patient/family education   PT Goals (Current goals can be found in the Care Plan section) Acute Rehab PT Goals Patient Stated Goal: to go home PT Goal Formulation: With patient/family Time For Goal Achievement: 04/17/15 Potential to Achieve Goals: Good    Frequency Min 5X/week   Barriers to discharge        Co-evaluation               End of Session Equipment Utilized During Treatment: Gait belt;Back brace Activity Tolerance: Patient tolerated treatment well;Patient limited by fatigue Patient left: in bed;with call bell/phone within reach;with family/visitor present Nurse Communication: Mobility status         Time: [id number] PT Time Calculation (min) (ACUTE ONLY): 20 min   Charges:   PT Evaluation $Initial PT Evaluation Tier I: 1 Procedure     PT G CodesFabio Asa:        Analicia Skibinski J 04/03/2015, 6:02 PM Charlotte Crumbevon Brain Honeycutt, PT DPT  [phone]
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
## 2015-04-03 NOTE — Progress Notes (Signed)
Awake, alert, conversant.  MAEW with good power.  Doing well.  

## 2015-04-03 NOTE — Progress Notes (Signed)
Utilization review completed.  

## 2015-04-03 NOTE — Brief Op Note (Signed)
04/03/2015  10:15 AM  PATIENT:  Kyle Wilkinson  53 y.o. male  PRE-OPERATIVE DIAGNOSIS:  Grade 2 Spondylolisthesis, Lumbar region; Spinal stenosis of lumbar region; Disc displacement, Lumbar; Degenerative disc disease, Lumbar, lumbar radiculopathy L 45 level  POST-OPERATIVE DIAGNOSIS:  Grade 2 Spondylolisthesis, Lumbar region; Spinal stenosis of lumbar region; Disc displacement, Lumbar; Degenerative disc disease, Lumbar, lumbar radiculopathy L 45 level  PROCEDURE:  Procedure(s) with comments: L4-5 Maximum access posterior lumbar interbody fusion (N/A) - L4-5 Maximum access posterior lumbar interbody fusion with PEEK cages, auto and allograft, pedicle screw fixation and posterolateral arthrodesis with autograft and allograft  Decompression greater than for standard PLIF procedure  SURGEON:  Surgeon(s) and Role:    * Merryl Buckels, MD - Primary    * Randy Kritzer, MD - Assisting  PHYSICIAN ASSISTANT:   ASSISTANTS: Poteat, RN   ANESTHESIA:   general  EBL:  Total I/O In: 1000 [I.V.:1000] Out: 175 [Urine:125; Blood:50]  BLOOD ADMINISTERED:none  DRAINS: none   LOCAL MEDICATIONS USED:  MARCAINE    and LIDOCAINE   SPECIMEN:  No Specimen  DISPOSITION OF SPECIMEN:  N/A  COUNTS:  YES  TOURNIQUET:  * No tourniquets in log *  DICTATION: Patient is a 53-year-old with  stenosis, Grade 2 spondylolisthesis, disc herniation and severe back and bilateral lower extremity pain at L4/5 levels of the lumbar spine. It was elected to take him to surgery for MASPLIF L 45 level with posterolateral arthrodesis.  Procedure:   Following uncomplicated induction of GETA, and placement of electrodes for neural monitoring, patient was turned into a prone position on the Jackson table and using AP  fluoroscopy the area of planned incision was marked, prepped with betadine scrub and Duraprep, then draped. Exposure was performed of facet joint complex at L 45 level and the MAS retractor was placed.5.5 x 40 mm  cortical Nuvasive screws were placed at L 4 bilaterally according to standard landmarks using neural monitoring.  A total laminectomy of L 4 was then performed with disarticulation of facets.  This bone was saved for grafting, combined with Osteocel after being run through bone mill and was placed in bone packing device.  Thorough discectomy was performed bilaterally at L 45 and the endplates were prepared for grafting.  Decompression was greater than for standard PLIF procedure.  Both L 4 nerve roots required painstaking decompression under Loupe magnification.  Decompression was greater than for standard PLIF procedure.  23 x 10 x 8 degree cages were placed in the interspace and positioning was confirmed with AP and lateral fluoroscopy.  10 cc of autograft/Osteocel was packed in the interspace medial to the second cage.   Remaining screws were placed at L 5 (5.5 x 40 mm) and 40 mm rods were placed.   And the screws were locked and torqued.Final Xrays showed well positioned implants and screw fixation. The posterolateral region was packed with remaining 15 cc of autograft on the right of midline. Subcutaneous musculature was infiltrated with long-acting Marcaine.  The wounds were irrigated and then closed with 1, 2-0 and 3-0 Vicryl stitches. Sterile occlusive dressing was placed with Dermabond and an occlusive dressing. The patient was then extubated in the operating room and taken to recovery in stable and satisfactory condition having tolerated her operation well. Counts were correct at the end of the case.  PLAN OF CARE: Admit to inpatient   PATIENT DISPOSITION:  PACU - hemodynamically stable.   Delay start of Pharmacological VTE agent (>24hrs) due to surgical blood loss   or risk of bleeding: yes

## 2015-04-04 ENCOUNTER — Encounter (HOSPITAL_COMMUNITY): Payer: Self-pay | Admitting: Neurosurgery

## 2015-04-04 LAB — GLUCOSE, CAPILLARY: GLUCOSE-CAPILLARY: 184 mg/dL — AB (ref 65–99)

## 2015-04-04 NOTE — Progress Notes (Signed)
Occupational Therapy Evaluation Patient Details Name: Kyle Wilkinson MRN: [id number] DOB: March 11, 1961 Today's Date: 04/04/2015    History of Present Illness Patient is a 54 yo male s/p L4-5 Maximum access posterior lumbar interbody fusion    Clinical Impression   Completed all education regarding back precautions for ADL and functional mobility for ADL. Pt/wife verbalized understanding. Written information reviewed. Pt ready to d/C home with initial 24/7 S.     Follow Up Recommendations  No OT follow up;Supervision/Assistance - 24 hour    Equipment Recommendations  None recommended by OT    Recommendations for Other Services       Precautions / Restrictions Precautions Precautions: Back Precaution Booklet Issued: Yes (comment) Precaution Comments: verbally reviewed with patient Required Braces or Orthoses: Spinal Brace Spinal Brace: Lumbar corset;Applied in sitting position Restrictions Weight Bearing Restrictions: No      Mobility Bed Mobility               General bed mobility comments: Able to return demonstrate proper technique from earlier session.  Transfers Overall transfer level: Needs assistance Equipment used: Rolling walker (2 wheeled)   Sit to Stand: S         General transfer comment: VCs for hand placement and safety    Balance     Sitting balance-Leahy Scale: Good       Standing balance-Leahy Scale: Fair                              ADL Overall ADL's : Needs assistance/impaired     Grooming: Set up   Upper Body Bathing: Set up   Lower Body Bathing: Moderate assistance   Upper Body Dressing : Set up   Lower Body Dressing: Moderate assistance   Toilet Transfer: Supervision/safety   Toileting- Clothing Manipulation and Hygiene: Minimal assistance Toileting - Clothing Manipulation Details (indicate cue type and reason): educated on use of AE forhygiene after toileting.     Functional mobility during ADLs:  Supervision/safety General ADL Comments: Completed educaitoin with pt/wife regarding compensatory techniques and use of AE and DME for ADL and funcitonal mobility for ADL adherring to back precautions. Pt/wife verbalized understanding. Wife will be home with pt and is able to assist as needed.      Vision     Perception     Praxis      Pertinent Vitals/Pain Pain Assessment: 0-10 Pain Score: 5  Pain Location: back Pain Descriptors / Indicators: Aching Pain Intervention(s): Limited activity within patient's tolerance;Monitored during session;Repositioned     Hand Dominance Right   Extremity/Trunk Assessment Upper Extremity Assessment Upper Extremity Assessment: Overall WFL for tasks assessed   Lower Extremity Assessment Lower Extremity Assessment: Defer to PT evaluation   Cervical / Trunk Assessment Cervical / Trunk Assessment: Normal   Communication Communication Communication: No difficulties   Cognition Arousal/Alertness: Awake/alert Behavior During Therapy: WFL for tasks assessed/performed Overall Cognitive Status: Within Functional Limits for tasks assessed                     General Comments       Exercises       Shoulder Instructions      Home Living Family/patient expects to be discharged to:: Private residence Living Arrangements: Spouse/significant other Available Help at Discharge: Family;Available 24 hours/day Type of Home: House Home Access: Stairs to enter [employer] of Steps: 5 Entrance Stairs-Rails: None Home Layout: Multi-level;Able to live on main  level with bedroom/bathroom     Bathroom Shower/Tub: Producer, television/film/videoWalk-in shower   Bathroom Toilet: Handicapped height Bathroom Accessibility: Yes   Home Equipment: Environmental consultantWalker - 2 wheels;Shower seat - built in          Prior Functioning/Environment Level of Independence: Independent with assistive device(s)        Comments: used a RW at times    OT Diagnosis: Generalized  weakness;Acute pain   OT Problem List: Decreased strength;Decreased activity tolerance;Decreased knowledge of use of DME or AE;Decreased knowledge of precautions;Pain   OT Treatment/Interventions:      OT Goals(Current goals can be found in the care plan section) Acute Rehab OT Goals Patient Stated Goal: to go home OT Goal Formulation: All assessment and education complete, DC therapy  OT Frequency:     Barriers to D/C:            Co-evaluation              End of Session Nurse Communication: Mobility status;Other (comment) (ready for D/C)  Activity Tolerance: Patient tolerated treatment well Patient left: in bed;with call bell/phone within reach;with family/visitor present   Time: [id number] OT Time Calculation (min): 19 min Charges:  OT General Charges $OT Visit: 1 Procedure OT Evaluation $Initial OT Evaluation Tier I: 1 Procedure G-Codes:    Lexi Conaty,HILLARY 04/04/2015, 11:59 AM   Luisa DagoHilary Ellisha Bankson, OTR/L  [phone] 04/04/2015

## 2015-04-04 NOTE — Progress Notes (Signed)
Physical Therapy Treatment Patient Details Name: Patrice ParadiseJonathan Brockwell MRN: [id number] DOB: November 04, 1960 Today's Date: 04/04/2015    History of Present Illness Patient is a 54 yo male s/p L4-5 Maximum access posterior lumbar interbody fusion     PT Comments    Patient mobilizing well, using RW. Performed stair negotiation, ambulation, and educated on car transfers in preparation for discharge. Patient with good compliance of precautions, able to recall 3/3. Anticipate patient will be safe for d/c home when medically ready.   Follow Up Recommendations  No PT follow up;Supervision - Intermittent     Equipment Recommendations  None recommended by PT    Recommendations for Other Services       Precautions / Restrictions Precautions Precautions: Back Precaution Booklet Issued: Yes (comment) Precaution Comments: verbally reviewed with patient Required Braces or Orthoses: Spinal Brace Spinal Brace: Lumbar corset;Applied in sitting position Restrictions Weight Bearing Restrictions: No    Mobility  Bed Mobility Overal bed mobility: Modified Independent Bed Mobility: Rolling;Sidelying to Sit;Sit to Sidelying              Transfers Overall transfer level: Modified independent Equipment used: Rolling walker (2 wheeled) Transfers: Sit to/from Stand Sit to Stand: Modified independent (Device/Increase time)            Ambulation/Gait Ambulation/Gait assistance: Modified independent (Device/Increase time) Ambulation Distance (Feet): 260 Feet Assistive device: Rolling walker (2 wheeled)   Gait velocity: decreased       Stairs Stairs: Yes Stairs assistance: Supervision Stair Management: One rail Right;Step to pattern Number of Stairs: 5 General stair comments: no physical assist required, increased time to perform  Wheelchair Mobility    Modified Rankin (Stroke Patients Only)       Balance     Sitting balance-Leahy Scale: Fair       Standing balance-Leahy  Scale: Fair                      Cognition Arousal/Alertness: Awake/alert Behavior During Therapy: WFL for tasks assessed/performed Overall Cognitive Status: Within Functional Limits for tasks assessed                      Exercises      General Comments General comments (skin integrity, edema, etc.): educated on car transfer, mobility expectations, pain management strategies and positional movements. Able to recall 3/3 precuations      Pertinent Vitals/Pain Pain Assessment: 0-10 Pain Score: 6  Pain Location: back Pain Descriptors / Indicators: Aching;Discomfort;Operative site guarding Pain Intervention(s): Monitored during session    Home Living                      Prior Function            PT Goals (current goals can now be found in the care plan section) Acute Rehab PT Goals Patient Stated Goal: to go home PT Goal Formulation: With patient/family Time For Goal Achievement: 04/17/15 Potential to Achieve Goals: Good Progress towards PT goals: Progressing toward goals    Frequency  Min 5X/week    PT Plan Current plan remains appropriate    Co-evaluation             End of Session Equipment Utilized During Treatment: Gait belt;Back brace Activity Tolerance: Patient tolerated treatment well;Patient limited by fatigue Patient left: in bed;with call bell/phone within reach;with family/visitor present     Time: 0702-0725 PT Time Calculation (min) (ACUTE ONLY): 23 min  Charges:  $Gait Training:  8-22 mins $Self Care/Home Management: 8-22                    G Codes:      Fabio Asa 04-17-15, 7:30 AM Charlotte Crumb, PT DPT  [phone]

## 2015-04-04 NOTE — Discharge Summary (Signed)
Physician Discharge Summary  Patient ID: Kyle ParadiseJonathan Talwar MRN: [id number] DOB/AGE: [birth year] 54 y.o.  Admit date: 04/03/2015 Discharge date: 04/04/2015  Admission Diagnoses:   Grade 2 Spondylolisthesis, Lumbar region; Spinal stenosis of lumbar region; Disc displacement, Lumbar; Degenerative disc disease, Lumbar, lumbar radiculopathy L 45 level   Discharge Diagnoses:   Grade 2 Spondylolisthesis, Lumbar region; Spinal stenosis of lumbar region; Disc displacement, Lumbar; Degenerative disc disease, Lumbar, lumbar radiculopathy L 45 level s/p L4-5 Maximum access posterior lumbar interbody fusion (N/A) - L4-5 Maximum access posterior lumbar interbody fusion with PEEK cages, auto and allograft, pedicle screw fixation and posterolateral arthrodesis with autograft and allograft  Active Problems:   Spondylolisthesis of lumbar region   Discharged Condition: good  Hospital Course: Kyle ParadiseJonathan Schmuhl was admitted for surgery with spondylolisthesis, radiculopathy.  Following uncomplicated MAS PLIF L4-5, he recovered well and transferred to 3500 for nursing care and therapies. He has progressed nicely  Consults: None  Significant Diagnostic Studies: radiology: X-Ray: intar-operative  Treatments: surgery: L4-5 Maximum access posterior lumbar interbody fusion (N/A) - L4-5 Maximum access posterior lumbar interbody fusion with PEEK cages, auto and allograft, pedicle screw fixation and posterolateral arthrodesis with autograft and allograft   Discharge Exam: Blood pressure 109/61, pulse 89, temperature 99.1 F (37.3 C), temperature source Oral, resp. rate 18, SpO2 96 %. Alert, conversant. MAEW. Good strength BLE. Ambulated with PT. incision beneath honeycomb drsg & Dermabond. No erythema, swelling, or drainage. No leg pain since surgery per pt, only incisional/lumbar pain.    Disposition: Discharge to home.  Pt verbalizes understanding of d/c instructions and agrees to call office to schedule 3-4 wk office f/u  visit. Rx' Percocet 5/325 1-2 po q4-6 hrs prn pain #60; Robaxin 500mg  1 po q8hrs prn spasm #60.        Medication List    ASK your doctor about these medications        aspirin EC 81 MG tablet  Take 81 mg by mouth daily.     JARDIANCE 10 MG Tabs tablet  Generic drug:  empagliflozin  Take 10 mg by mouth daily.     lisinopril 10 MG tablet  Commonly known as:  PRINIVIL,ZESTRIL  Take 10 mg by mouth daily.     metFORMIN 500 MG tablet  Commonly known as:  GLUCOPHAGE  Take 500 mg by mouth 2 (two) times daily with a meal.     rosuvastatin 10 MG tablet  Commonly known as:  CRESTOR  Take 10 mg by mouth daily.     TRULICITY 0.75 MG/0.5ML Sopn  Generic drug:  Dulaglutide  Inject 0.75 mg into the skin once a week.         Signed: Georgiann Cockeroteat, Jay Kempe 04/04/2015, 8:01 AM

## 2015-04-04 NOTE — Progress Notes (Signed)
Subjective: Patient reports "I have some pain in my low back, especially when I turn over, but not in my legs anymore"  Objective: Vital signs in last 24 hours: Temp:  [97.9 F (36.6 C)-99.1 F (37.3 C)] 99.1 F (37.3 C) (07/08 0400) Pulse Rate:  [66-96] 89 (07/08 0400) Resp:  [15-27] 18 (07/08 0400) BP: (92-143)/(41-86) 109/61 mmHg (07/08 0400) SpO2:  [96 %-100 %] 96 % (07/08 0400)  Intake/Output from previous day: 07/07 0701 - 07/08 0700 In: 1600 [I.V.:1600] Out: 175 [Urine:125; Blood:50] Intake/Output this shift:    Alert, conversant. MAEW. Good strength BLE. Ambulated with PT. incision beneath honeycomb drsg & Dermabond. No erythema, swelling, or drainage. No leg pain since surgery per pt, only incisional/lumbar pain.   Lab Results: No results for input(s): WBC, HGB, HCT, PLT in the last 72 hours. BMET No results for input(s): NA, K, CL, CO2, GLUCOSE, BUN, CREATININE, CALCIUM in the last 72 hours.  Studies/Results: Dg Lumbar Spine 2-3 Views  04/03/2015   CLINICAL DATA:  L4-5 fixation.  EXAM: DG C-ARM 61-120 MIN; LUMBAR SPINE - 2-3 VIEW  COMPARISON:  02/03/2015 outside radiographs.  FINDINGS: AP and lateral views. These demonstrate placement of trans pedicle screws at the L4-5 level. Interbody fusion material. Persistent anterolisthesis of L4 on L5, grade 1-2.  IMPRESSION: Intraoperative imaging demonstrating L4-5 fixation.   Electronically Signed   By: Jeronimo GreavesKyle  Talbot M.D.   On: 04/03/2015 10:22   Dg C-arm 1-60 Min  04/03/2015   CLINICAL DATA:  L4-5 fixation.  EXAM: DG C-ARM 61-120 MIN; LUMBAR SPINE - 2-3 VIEW  COMPARISON:  02/03/2015 outside radiographs.  FINDINGS: AP and lateral views. These demonstrate placement of trans pedicle screws at the L4-5 level. Interbody fusion material. Persistent anterolisthesis of L4 on L5, grade 1-2.  IMPRESSION: Intraoperative imaging demonstrating L4-5 fixation.   Electronically Signed   By: Jeronimo GreavesKyle  Talbot M.D.   On: 04/03/2015 10:22     Assessment/Plan: Improving   LOS: 1 day  Per DrStern, d/c IV, d/c to home. Pt verbalizes understanding of d/c instructions and agrees to call office to schedule 3-4 wk office f/u visit. Rx' Percocet 5/325 1-2 po q4-6 hrs prn pain #60; Robaxin 500mg  1 po q8hrs prn spasm #60.     Georgiann Cockeroteat, Kmari Halter 04/04/2015, 7:56 AM

## 2015-04-04 NOTE — Progress Notes (Signed)
Patient alert and oriented, mae's well, voiding adequate amount of urine, swallowing without difficulty, c/o moderate pain and meds given prior to discharged. Patient discharged home with family. Script and discharged instructions given to patient. Patient and family stated understanding of instructions given.  

## 2015-10-03 IMAGING — RF DG LUMBAR SPINE 2-3V
1 series · 2 of 2 positions shown · non-contrast
Comparison: 02/03/2015 outside radiographs.

CLINICAL DATA: L4-5 fixation.

EXAM:
DG C-ARM 61-120 MIN; LUMBAR SPINE - 2-3 VIEW

[Series 1: run · 2 of 2 slices shown]
[im 1/2]
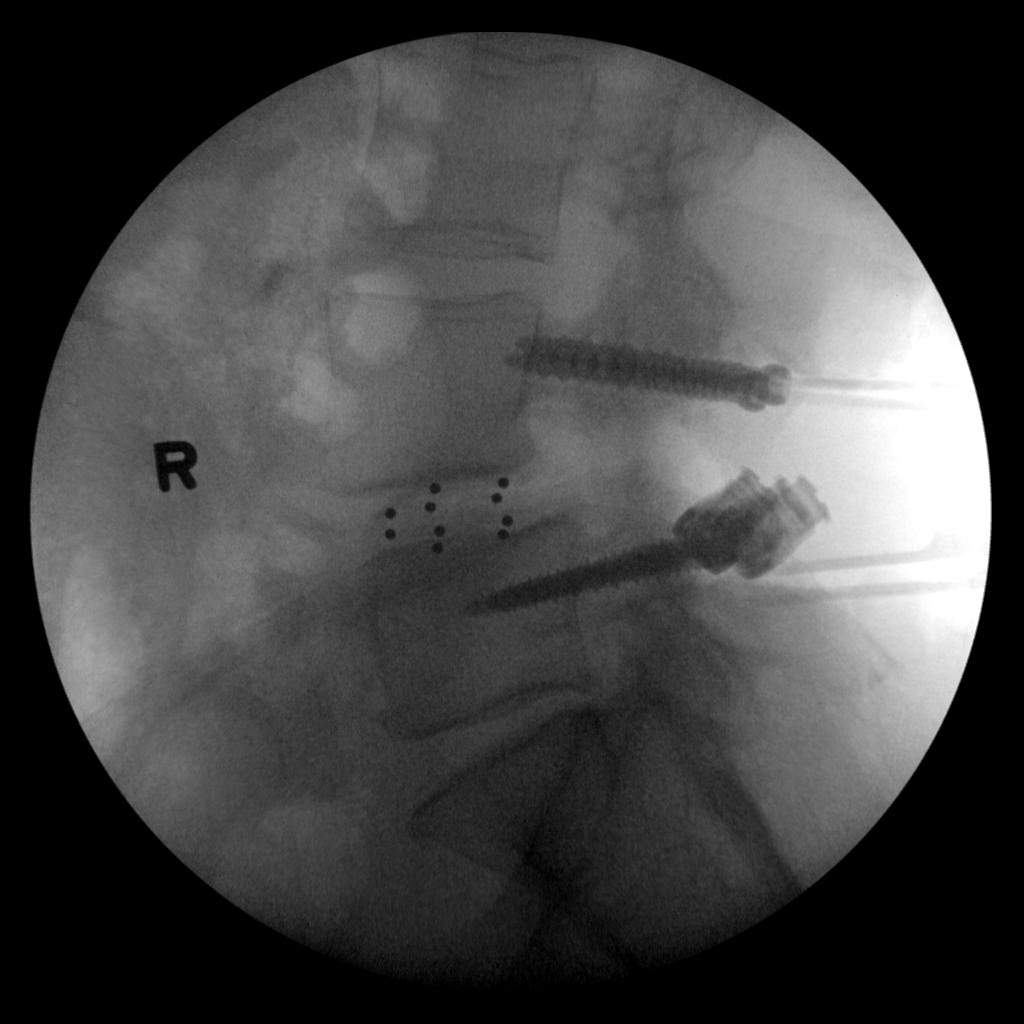
[im 2/2]
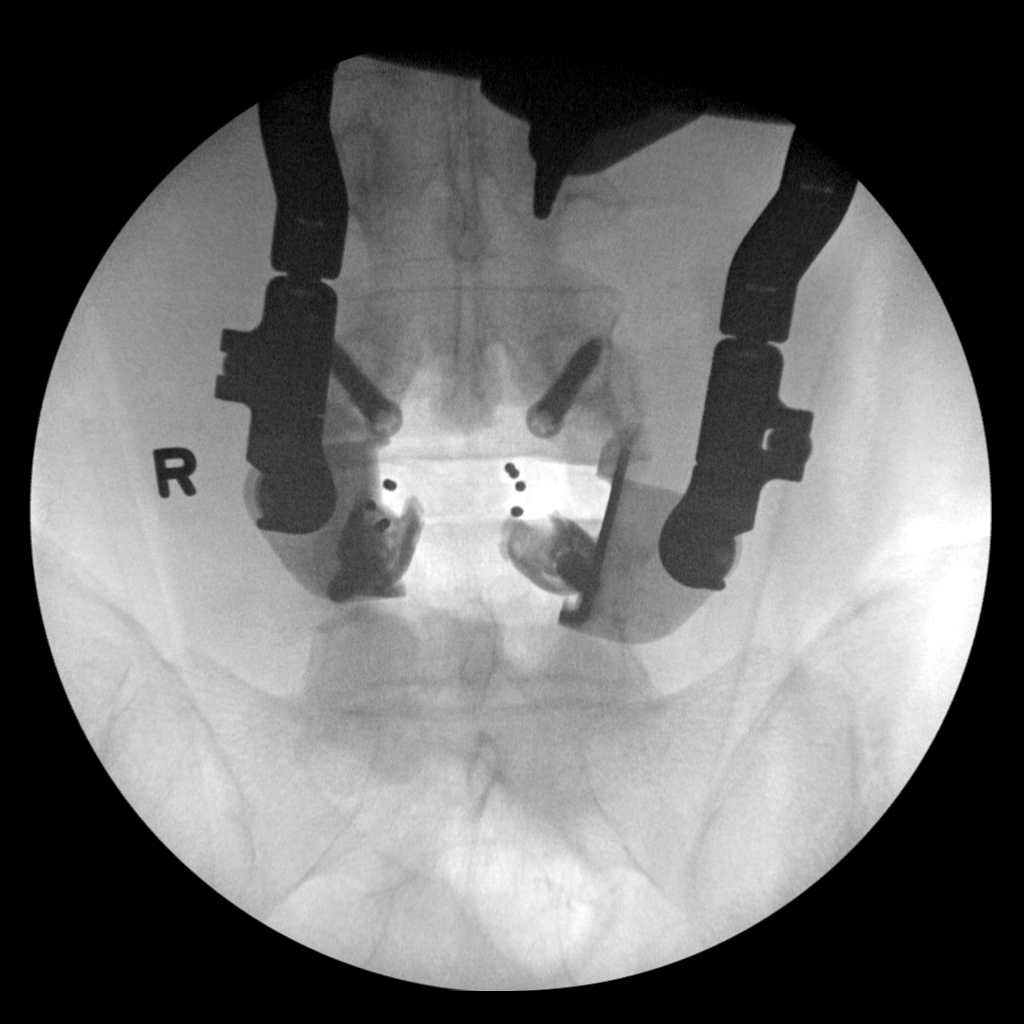

[2 of 2 positions shown; findings below may reference images not displayed]

FINDINGS: AP and lateral views. These demonstrate placement of trans pedicle
screws at the L4-5 level. Interbody fusion material. Persistent
anterolisthesis of L4 on L5, grade 1-2.
IMPRESSION: Intraoperative imaging demonstrating L4-5 fixation.
